# Patient Record
Sex: Female | Born: 1995 | Race: Black or African American | Hispanic: No | Marital: Single | Smoking: Never smoker
Health system: Southern US, Community
[De-identification: ages and names within clinical notes are randomized; demographics above are authoritative.]

---

## 2020-06-30 ENCOUNTER — Emergency Department (HOSPITAL_COMMUNITY)
Admission: EM | Admit: 2020-06-30 | Discharge: 2020-06-30 | Disposition: A | Discharge status: Home / Self Care | Payer: Medicaid Other | Attending: Emergency Medicine | Admitting: Emergency Medicine

## 2020-06-30 ENCOUNTER — Encounter (HOSPITAL_COMMUNITY): Payer: Self-pay

## 2020-06-30 ENCOUNTER — Other Ambulatory Visit: Payer: Self-pay

## 2020-06-30 ENCOUNTER — Emergency Department (HOSPITAL_COMMUNITY): Payer: Medicaid Other

## 2020-06-30 DIAGNOSIS — W231XXA Caught, crushed, jammed, or pinched between stationary objects, initial encounter: Secondary | ICD-10-CM | POA: Insufficient documentation

## 2020-06-30 DIAGNOSIS — Z23 Encounter for immunization: Secondary | ICD-10-CM | POA: Diagnosis not present

## 2020-06-30 DIAGNOSIS — Y92810 Car as the place of occurrence of the external cause: Secondary | ICD-10-CM | POA: Diagnosis not present

## 2020-06-30 DIAGNOSIS — S62663A Nondisplaced fracture of distal phalanx of left middle finger, initial encounter for closed fracture: Secondary | ICD-10-CM | POA: Insufficient documentation

## 2020-06-30 DIAGNOSIS — S6710XA Crushing injury of unspecified finger(s), initial encounter: Secondary | ICD-10-CM

## 2020-06-30 DIAGNOSIS — Y999 Unspecified external cause status: Secondary | ICD-10-CM | POA: Diagnosis not present

## 2020-06-30 DIAGNOSIS — S67193A Crushing injury of left middle finger, initial encounter: Secondary | ICD-10-CM | POA: Diagnosis present

## 2020-06-30 DIAGNOSIS — Y9389 Activity, other specified: Secondary | ICD-10-CM | POA: Insufficient documentation

## 2020-06-30 DIAGNOSIS — S62668A Nondisplaced fracture of distal phalanx of other finger, initial encounter for closed fracture: Secondary | ICD-10-CM

## 2020-06-30 MED ORDER — TETANUS-DIPHTH-ACELL PERTUSSIS 5-2.5-18.5 LF-MCG/0.5 IM SUSP
0.5000 mL | Freq: Once | INTRAMUSCULAR | Status: AC
  Administered 2020-06-30: 0.5 mL via INTRAMUSCULAR
  Filled 2020-06-30: qty 0.5

## 2020-06-30 NOTE — Discharge Instructions (Addendum)
You have been seen for a crush finger.  X-ray showed you have a fracture of the end of your middle finger.  You have been placed in a finger splint please keep on especially while sleeping, you may take it off for showering.  You may alternate between taking ibuprofen Tylenol every 6 hours for pain.  For example you can take ibuprofen wait 6 hours then take Tylenol wait another 6 hours and then repeat.  Please follow dosing of the back of bottle.  I recommend that you keep the finger elevated and apply ice to the area as this will help with inflammation and swelling.  I want you to follow-up with orthopedic doctor for further evaluation management.  I have given you the contact of information 1 please call your earliest convenience.  I want you to come back to the emergency department if you develop numbness or tingling in the distal end of your finger, your finger turns black or a different color, you have severe pain, chest pain, shortness of breath, uncontrolled nausea, vomiting diarrhea as he symptoms require further evaluation management.

## 2020-06-30 NOTE — ED Provider Notes (Signed)
[facility]-EMERGENCY DEPT Provider Note   CSN: [id number] Arrival date & time: 06/30/20  1544     History Chief Complaint  Patient presents with  . Finger Injury    Deborah Graham is a 24 y.o. female.  HPI   Patient presents to the emergency department with chief complaint of left middle finger pain that started today.  Patient explains she had her middle finger slammed in a car door.  She admits that it hurts when she bends it but denies hitting her head, losing consciousness, or being on anticoagulant.  Patient denies any alleviating factor, denies this ever happened to her before.  She has not taking any medication for it.  She has no significant medical history, does not take any medication on daily basis.  She denies headache, fever, chills, shortness of breath, chest pain, abdominal pain, dysuria, pedal edema.  History reviewed. No pertinent past medical history.  There are no problems to display for this patient.   History reviewed. No pertinent surgical history.   OB History   No obstetric history on file.     Family History  Problem Relation Age of Onset  . Hypertension Mother   . Hypertension Father     Social History   Tobacco Use  . Smoking status: Never Smoker  . Smokeless tobacco: Never Used  Vaping Use  . Vaping Use: Never used  Substance Use Topics  . Alcohol use: Never  . Drug use: Never    Home Medications Prior to Admission medications   Not on File    Allergies    Patient has no known allergies.  Review of Systems   Review of Systems  Constitutional: Negative for chills and fever.  HENT: Negative for congestion, sore throat and tinnitus.   Eyes: Negative for pain.  Respiratory: Negative for shortness of breath.   Cardiovascular: Negative for chest pain and leg swelling.  Gastrointestinal: Negative for abdominal pain, diarrhea, nausea and vomiting.  Genitourinary: Negative for enuresis and flank pain.   Musculoskeletal: Negative for back pain.       Admits to left middle finger pain  Skin: Negative for rash.  Neurological: Negative for dizziness, light-headedness and headaches.  Hematological: Does not bruise/bleed easily.    Physical Exam Updated Vital Signs BP (!) 160/100 (BP Location: Right Arm)   Pulse 79   Temp 99.8 F (37.7 C) (Oral)   Resp 20   Ht 5\' 2"  (1.575 m)   Wt 99.8 kg   LMP 06/06/2020 (Approximate)   SpO2 98%   BMI 40.24 kg/m   Physical Exam Vitals and nursing note reviewed.  Constitutional:      General: She is not in acute distress.    Appearance: Normal appearance. She is not ill-appearing or diaphoretic.  HENT:     Head: Normocephalic and atraumatic.     Nose: No congestion or rhinorrhea.  Eyes:     General: No scleral icterus.       Right eye: No discharge.        Left eye: No discharge.     Conjunctiva/sclera: Conjunctivae normal.  Pulmonary:     Effort: Pulmonary effort is normal. No respiratory distress.     Breath sounds: Normal breath sounds. No wheezing.  Musculoskeletal:     Cervical back: Neck supple.     Right lower leg: No edema.     Left lower leg: No edema.     Comments: Patient's left middle finger was visualized, there is  a small abrasion above her fingernail.  There is no nailbed involvement.  She was able to ambulate the distal end of her finger as well as the middle and proximal joints, sensation was fully intact, good capillary refill, sensation fully intact.  Skin:    General: Skin is warm and dry.     Coloration: Skin is not jaundiced or pale.  Neurological:     Mental Status: She is alert and oriented to person, place, and time.  Psychiatric:        Mood and Affect: Mood normal.     ED Results / Procedures / Treatments   Labs (all labs ordered are listed, but only abnormal results are displayed) Labs Reviewed - No data to display  EKG None  Radiology DG Finger Middle Left  Result Date: 06/30/2020 CLINICAL DATA:   Left middle finger injury EXAM: LEFT MIDDLE FINGER 2+V COMPARISON:  None. FINDINGS: There is a comminuted fracture of the distal tuft of the left middle finger with fracture fragments in grossly anatomic alignment. Normal overall alignment. No other fracture or dislocation identified. Joint spaces are preserved. Mild surrounding soft tissue swelling. IMPRESSION: Aligned comminuted fracture of the distal tuft of the left middle finger. Electronically Signed   By: Helyn Numbers MD   On: 06/30/2020 17:56    Procedures Procedures (including critical care time)  Medications Ordered in ED Medications  Tdap (BOOSTRIX) injection 0.5 mL (has no administration in time range)    ED Course  I have reviewed the triage vital signs and the nursing notes.  Pertinent labs & imaging results that were available during my care of the patient were reviewed by me and considered in my medical decision making (see chart for details).    MDM Rules/Calculators/A&P                          I have personally reviewed all imaging, labs and have interpreted them.  X-ray of the finger was performed and showed aligned comminuted fracture of the distal tuft of the left middle finger.  There were no signs of compartment syndrome as neurovascular was fully intact, finger was soft to touch, and she was able to move it without difficulty.  There was a small abrasion above her fingernail does not meet criteria to be sutured closed and there is no signs of an open fracture.  Unlikely there is tendon or ligament damage as patient was able to move the distal, middle and proximal joints without difficulty.  Was nontoxic-appearing, vital signs reassuring, physical exam benign further imaging and lab work were not indicated.  Patient's finger will be cleaned, given a tetanus shot and placed in a splint.  Patient appears to be resting comfortably in bed show no acute signs distress.  Vital signs have remained stable does not meet  criteria to be admitted to the hospital.  Likely patient suffered a distal fracture of the tuft of the left middle finger, will place in a splint and refer patient to a hand specialist for further evaluation management.  Patient discussed with attending who agrees assessment and plan.  Patient was given at home care as well as strict return precautions.  Patient verbalized that she understood and agree with said plan. Final Clinical Impression(s) / ED Diagnoses Final diagnoses:  Crushing injury of finger, initial encounter  Closed nondisplaced fracture of distal phalanx of middle finger, unspecified laterality, initial encounter    Rx / DC Orders ED Discharge  Orders    None       Barnie Del 06/30/20 1856    Tegeler, Canary Brim, MD 06/30/20 [phone]

## 2020-06-30 NOTE — ED Triage Notes (Signed)
Patient states she was trying to get into a door at her home and the person on the other side was pushing the door closed. Patient's left middle finger was shut in the door.

## 2020-06-30 NOTE — ED Notes (Signed)
An After Visit Summary was printed and given to the patient. Discharge instructions given and no further questions at this time.  

## 2021-04-08 IMAGING — DX DG FINGER MIDDLE 2+V*L*
3 series · 3 of 3 positions shown · non-contrast
Comparison: None.

CLINICAL DATA: Left middle finger injury

EXAM:
LEFT MIDDLE FINGER 2+V

[finger ap]
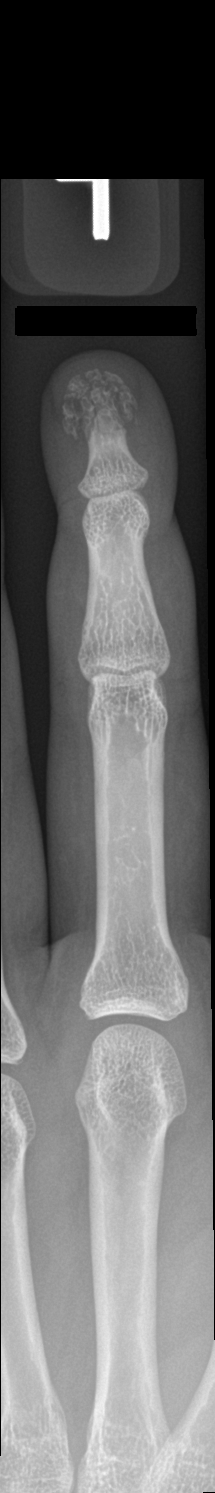

[finger obl]
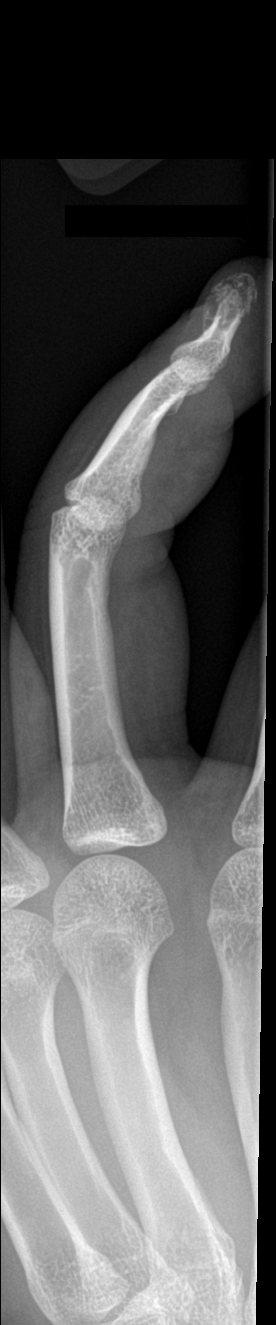

[finger lat]
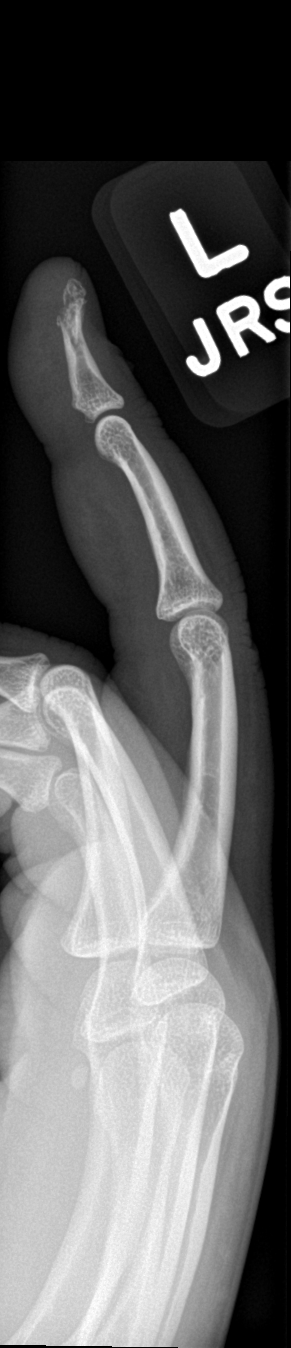

[3 of 3 positions shown; findings below may reference images not displayed]

FINDINGS: There is a comminuted fracture of the distal tuft of the left middle
finger with fracture fragments in grossly anatomic alignment. Normal
overall alignment. No other fracture or dislocation identified.
Joint spaces are preserved. Mild surrounding soft tissue swelling.
IMPRESSION: Aligned comminuted fracture of the distal tuft of the left middle
finger.
